# Patient Record
Sex: Female | Born: 1955 | Race: White | Smoking: Never smoker
Health system: Southern US, Community
[De-identification: ages and names within clinical notes are randomized; demographics above are authoritative.]

---

## 2014-01-23 DIAGNOSIS — H409 Unspecified glaucoma: Secondary | ICD-10-CM | POA: Insufficient documentation

## 2014-01-23 DIAGNOSIS — R928 Other abnormal and inconclusive findings on diagnostic imaging of breast: Secondary | ICD-10-CM | POA: Insufficient documentation

## 2014-01-23 DIAGNOSIS — N951 Menopausal and female climacteric states: Secondary | ICD-10-CM | POA: Insufficient documentation

## 2014-11-27 ENCOUNTER — Ambulatory Visit (INDEPENDENT_AMBULATORY_CARE_PROVIDER_SITE_OTHER): Payer: BLUE CROSS/BLUE SHIELD | Admitting: Family Medicine

## 2014-11-27 ENCOUNTER — Encounter: Payer: Self-pay | Admitting: Family Medicine

## 2014-11-27 ENCOUNTER — Encounter (INDEPENDENT_AMBULATORY_CARE_PROVIDER_SITE_OTHER): Payer: Self-pay

## 2014-11-27 VITALS — BP 141/85 | Ht 63.0 in | Wt 125.0 lb

## 2014-11-27 DIAGNOSIS — M25562 Pain in left knee: Secondary | ICD-10-CM

## 2014-11-27 MED ORDER — METHYLPREDNISOLONE ACETATE 40 MG/ML IJ SUSP
40.0000 mg | Freq: Once | INTRAMUSCULAR | Status: AC
  Administered 2014-11-27: 40 mg via INTRA_ARTICULAR

## 2014-11-27 NOTE — Patient Instructions (Signed)
Your knee pain is likely due to a flare of arthritis vs degenerative meniscus tear. Both are treated similarly. These are the medications you can take for this: Tylenol  1-2 tabs three times a day for pain. Aleve 1-2 tabs twice a day with food Glucosamine sulfate  twice a day is a supplement that may help. Capsaicin, aspercreme, or biofreeze topically up to four times a day may also help with pain. Cortisone injections are an option - you were given this today. If cortisone injections do not help, there are different types of shots that may help but they take longer to take effect. It's important that you continue to stay active. Straight leg raises, knee extensions 3 sets of 10 once a day (add ankle weight if these become too easy). Consider physical therapy to strengthen muscles around the joint that hurts to take pressure off of the joint itself. Fogel inserts with good arch support may be helpful. Heat or ice 15 minutes at a time 3-4 times a day as needed to help with pain. Water aerobics and cycling with low resistance are the best two types of exercise for arthritis. I would wait a week before returning to running. Follow up with me in 1 month or as needed.

## 2014-12-01 DIAGNOSIS — M25562 Pain in left knee: Secondary | ICD-10-CM | POA: Insufficient documentation

## 2014-12-01 NOTE — Assessment & Plan Note (Signed)
consistent with a flare of DJD vs degenerative meniscus tear.  Discussed options - went ahead with aspiration and injection - fluid consistent with either diagnosis above.  Discussed tylenol, nsaids, glucosamine, topical medications.  Home exercises as well, icing as needed.  F/u in 1 month or prn.  After informed written consent patient was lying supine on exam table.  Left knee was prepped with alcohol swab.  Utilizing superolateral approach, 3 mL of marcaine was used for local anesthesia.  Then using an 18g needle on 60cc syringe, 18 mL of clear straw-colored fluid was aspirated from left knee.  Knee was then injected with 3:1 marcaine:depomedrol.  Patient tolerated procedure well without immediate complications

## 2014-12-01 NOTE — Progress Notes (Signed)
PCP: No primary care provider on file.  Subjective:   HPI: Patient is a 59 y.o. female here for left knee pain.  Patient reports initially had problems with left knee about 1 1/2 years ago - running on [address],finished and left knee was sore. Improved after icing, taking ibuprofen. Then on Wednesday did same run about 3 miles and felt pain medial in left knee. Has been icing, taking aleve, elevating. Pain level still a 5/10. No catching, locking, giving out. Knee is swelling.  No past medical history on file.  No current outpatient prescriptions on file prior to visit.   No current facility-administered medications on file prior to visit.    No past surgical history on file.  Allergies  Allergen Reactions  . Penicillins   . Sulfa Antibiotics     Social History   Social History  . Marital Status: Divorced    Spouse Name: N/A  . Number of Children: N/A  . Years of Education: N/A   Occupational History  . Not on file.   Social History Main Topics  . Smoking status: Never Smoker   . Smokeless tobacco: Not on file  . Alcohol Use: Not on file  . Drug Use: Not on file  . Sexual Activity: Not on file   Other Topics Concern  . Not on file   Social History Narrative  . No narrative on file    No family history on file.  BP 141/85 mmHg  Ht  (1.6 m)  Wt 125 lb (56.7 kg)  BMI 22.15 kg/m2  Review of Systems: See HPI above.    Objective:  Physical Exam:  Gen: NAD  Left knee: Mild effusion.  No other gross deformity, ecchymoses. TTP medial joint line mildly.  No other tenderness. FROM. Negative ant/post drawers. Negative valgus/varus testing. Negative lachmanns. Negative mcmurrays, apleys, patellar apprehension. NV intact distally.    Assessment & Plan:  1. Left knee pain - consistent with a flare of DJD vs degenerative meniscus tear.  Discussed options - went ahead with aspiration and injection - fluid consistent with either diagnosis above.   Discussed tylenol, nsaids, glucosamine, topical medications.  Home exercises as well, icing as needed.  F/u in 1 month or prn.  After informed written consent patient was lying supine on exam table.  Left knee was prepped with alcohol swab.  Utilizing superolateral approach, 3 mL of marcaine was used for local anesthesia.  Then using an 18g needle on 60cc syringe, 18 mL of clear straw-colored fluid was aspirated from left knee.  Knee was then injected with 3:1 marcaine:depomedrol.  Patient tolerated procedure well without immediate complications

## 2015-04-07 ENCOUNTER — Encounter: Payer: Self-pay | Admitting: Family Medicine

## 2015-04-07 ENCOUNTER — Encounter (INDEPENDENT_AMBULATORY_CARE_PROVIDER_SITE_OTHER): Payer: Self-pay

## 2015-04-07 ENCOUNTER — Ambulatory Visit (HOSPITAL_BASED_OUTPATIENT_CLINIC_OR_DEPARTMENT_OTHER)
Admission: RE | Admit: 2015-04-07 | Discharge: 2015-04-07 | Disposition: A | Discharge status: Home / Self Care | Payer: BLUE CROSS/BLUE SHIELD | Source: Ambulatory Visit | Attending: Family Medicine | Admitting: Family Medicine

## 2015-04-07 ENCOUNTER — Ambulatory Visit (INDEPENDENT_AMBULATORY_CARE_PROVIDER_SITE_OTHER): Payer: BLUE CROSS/BLUE SHIELD | Admitting: Family Medicine

## 2015-04-07 VITALS — BP 132/84 | HR 73 | Ht 63.0 in | Wt 125.0 lb

## 2015-04-07 DIAGNOSIS — S99922A Unspecified injury of left foot, initial encounter: Secondary | ICD-10-CM | POA: Insufficient documentation

## 2015-04-07 DIAGNOSIS — M7989 Other specified soft tissue disorders: Secondary | ICD-10-CM | POA: Diagnosis not present

## 2015-04-07 DIAGNOSIS — X501XXA Overexertion from prolonged static or awkward postures, initial encounter: Secondary | ICD-10-CM | POA: Diagnosis not present

## 2015-04-07 NOTE — Patient Instructions (Signed)
You have a small avulsion fracture of your calcaneus (heel bone). Tylenol 500mg  1-2 tabs three times a day if needed for pain. Ibuprofen 600mg  three times a day if needed with food for pain and inflammation. Wear boot when up and walking around for next 4 weeks. Icing 15 minutes at a time 3-4 times a day. Elevate above your heart when possible. Follow up with me in 4 weeks for reevaluation.

## 2015-04-09 DIAGNOSIS — S99922A Unspecified injury of left foot, initial encounter: Secondary | ICD-10-CM | POA: Insufficient documentation

## 2015-04-09 NOTE — Progress Notes (Signed)
PCP: No primary care provider on file.  Subjective:   HPI: Patient is a 59 y.o. female here for left ankle pain.  Patient reports she was running down the steps yesterday on 12/27 when her foot slipped, she fell 2 steps and heard a snap lateral left foot/ankle. + swelling. Difficulty bearing weight initially but has improved. Pain mild throbbing, lateral at 5/10 level. Taking aleve. No prior injuries here. No skin changes, fever, other complaints.  No past medical history on file.  No current outpatient prescriptions on file prior to visit.   No current facility-administered medications on file prior to visit.    No past surgical history on file.  Allergies  Allergen Reactions  . Penicillins   . Sulfa Antibiotics     Social History   Social History  . Marital Status: Divorced    Spouse Name: N/A  . Number of Children: N/A  . Years of Education: N/A   Occupational History  . Not on file.   Social History Main Topics  . Smoking status: Never Smoker   . Smokeless tobacco: Not on file  . Alcohol Use: Not on file  . Drug Use: Not on file  . Sexual Activity: Not on file   Other Topics Concern  . Not on file   Social History Narrative    No family history on file.  BP 132/84 mmHg  Pulse 73  Ht 5\' 3"  (1.6 m)  Wt 125 lb (56.7 kg)  BMI 22.15 kg/m2  Review of Systems: See HPI above.    Objective:  Physical Exam:  Gen: NAD  Left ankle/foot: Mod swelling lateral dorsal foot. FROM TTP proximal dorsal lateral foot.  No malleolar, base 5th, navicular, other tenderness. Negative ant drawer and talar tilt.   Negative syndesmotic compression. Thompsons test negative. NV intact distally.  Right foot/ankle: FROM without pain.  Assessment & Plan:  1. Left foot injury - radiographs independently reviewed - small avulsion off calcaneus.  Should do well with conservative treatment.  Cam walker with weight bearing as tolerated.  Tylenol, nsaids as needed.   Icing, elevation.  F/u in 4 weeks for reevaluation.

## 2015-04-09 NOTE — Assessment & Plan Note (Signed)
radiographs independently reviewed - small avulsion off calcaneus.  Should do well with conservative treatment.  Cam walker with weight bearing as tolerated.  Tylenol, nsaids as needed.  Icing, elevation.  F/u in 4 weeks for reevaluation.

## 2015-05-05 ENCOUNTER — Ambulatory Visit (INDEPENDENT_AMBULATORY_CARE_PROVIDER_SITE_OTHER): Payer: BLUE CROSS/BLUE SHIELD | Admitting: Family Medicine

## 2015-05-05 ENCOUNTER — Encounter: Payer: Self-pay | Admitting: Family Medicine

## 2015-05-05 VITALS — BP 122/75 | HR 64 | Ht 63.0 in | Wt 125.0 lb

## 2015-05-05 DIAGNOSIS — S99922D Unspecified injury of left foot, subsequent encounter: Secondary | ICD-10-CM

## 2015-05-10 NOTE — Progress Notes (Signed)
PCP: No primary care provider on file.  Subjective:   HPI: Patient is a 60 y.o. female here for left ankle pain.  04/07/15: Patient reports she was running down the steps yesterday on 12/27 when her foot slipped, she fell 2 steps and heard a snap lateral left foot/ankle. + swelling. Difficulty bearing weight initially but has improved. Pain mild throbbing, lateral at 5/10 level. Taking aleve. No prior injuries here. No skin changes, fever, other complaints.  05/05/15: Patient reports she is much better. Pain level 0/10. Slight swelling as only complaint. No skin changes, fever.  No past medical history on file.  Current Outpatient Prescriptions on File Prior to Visit  Medication Sig Dispense Refill  . bimatoprost (LUMIGAN) 0.03 % ophthalmic solution 1 drop nightly.    Marland Kitchen estradiol (ESTRACE) 1 MG tablet Take 1 mg by mouth.    . progesterone (PROMETRIUM) 100 MG capsule Take 100 mg by mouth.     No current facility-administered medications on file prior to visit.    No past surgical history on file.  Allergies  Allergen Reactions  . Penicillins   . Sulfa Antibiotics     Social History   Social History  . Marital Status: Divorced    Spouse Name: N/A  . Number of Children: N/A  . Years of Education: N/A   Occupational History  . Not on file.   Social History Main Topics  . Smoking status: Never Smoker   . Smokeless tobacco: Not on file  . Alcohol Use: Not on file  . Drug Use: Not on file  . Sexual Activity: Not on file   Other Topics Concern  . Not on file   Social History Narrative    No family history on file.  BP 122/75 mmHg  Pulse 64  Ht  (1.6 m)  Wt 125 lb (56.7 kg)  BMI 22.15 kg/m2  Review of Systems: See HPI above.    Objective:  Physical Exam:  Gen: NAD  Left ankle/foot: Minimal swelling lateral dorsal foot. FROM No TTP proximal dorsal lateral foot.  No malleolar, base 5th, navicular, other tenderness. Negative ant drawer and  talar tilt.   Negative syndesmotic compression. Thompsons test negative. NV intact distally.  Right foot/ankle: FROM without pain.  Assessment & Plan:  1. Left foot injury - radiographs independently reviewed - small avulsion off calcaneus.  Clinically healed at this point.  Cleared for all activities without restrictions.  F/u prn.

## 2015-05-10 NOTE — Assessment & Plan Note (Signed)
radiographs independently reviewed - small avulsion off calcaneus.  Clinically healed at this point.  Cleared for all activities without restrictions.  F/u prn.

## 2017-07-08 IMAGING — DX DG FOOT COMPLETE 3+V*L*
3 series · 3 of 3 positions shown · non-contrast
Comparison: None.

CLINICAL DATA: Pain and swelling on the dorsum of the foot after
the patient sustained an inversion injury last night.

EXAM:
LEFT FOOT - COMPLETE 3+ VIEW

[foot ap]
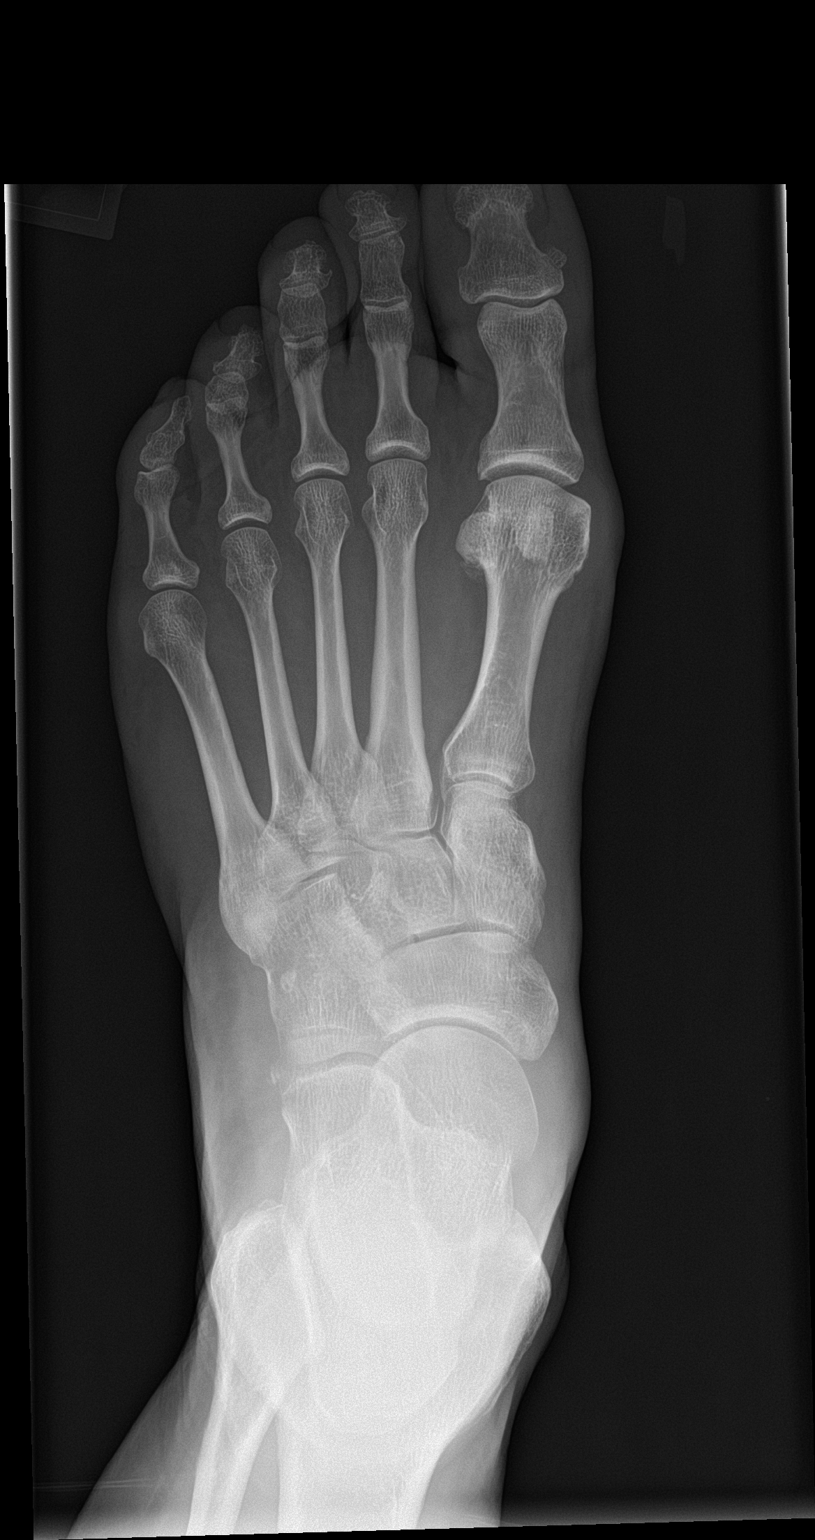

[foot obl]
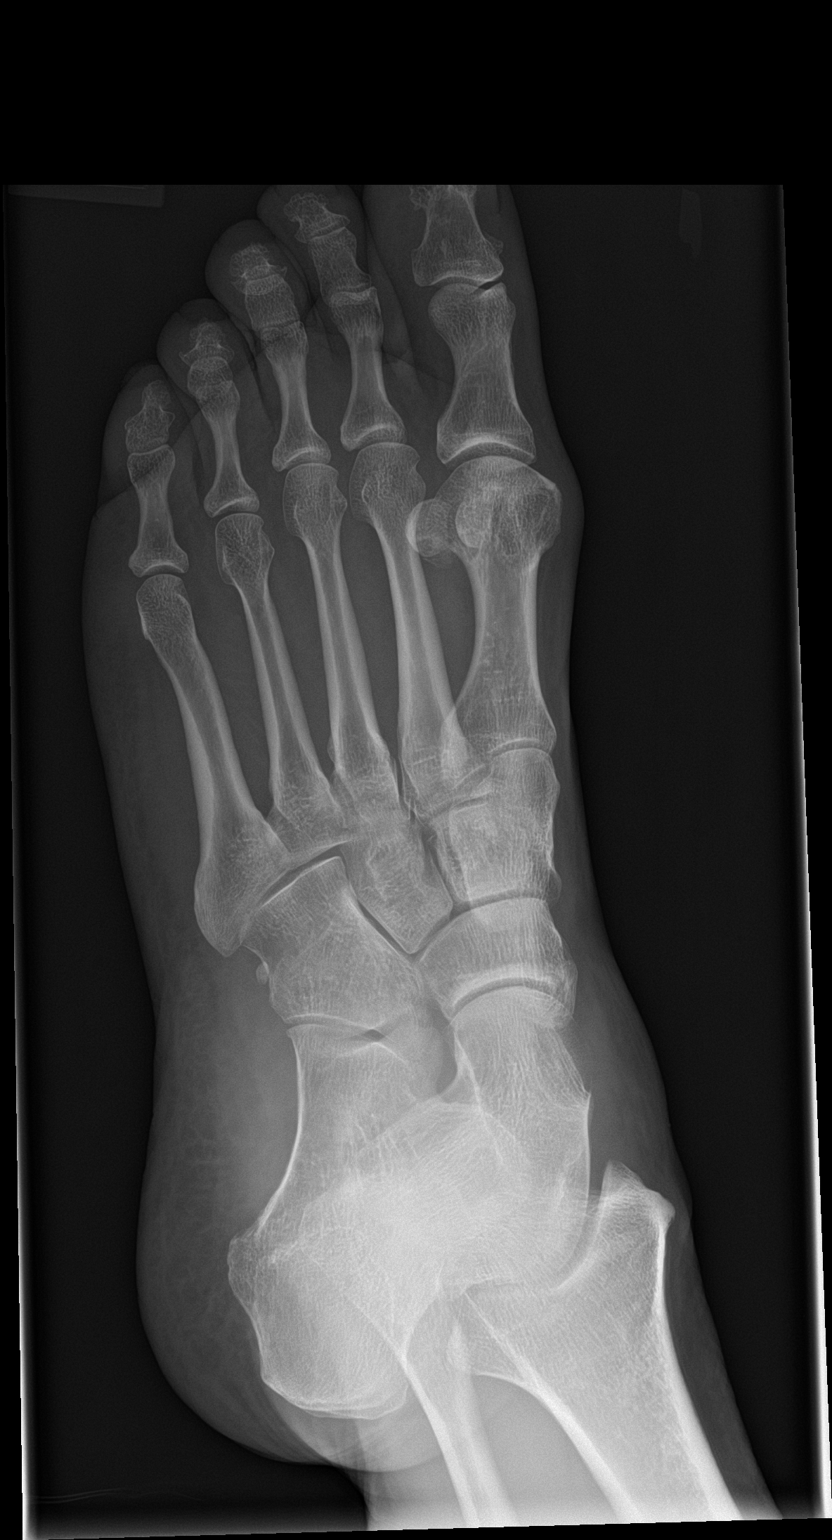

[foot lat]
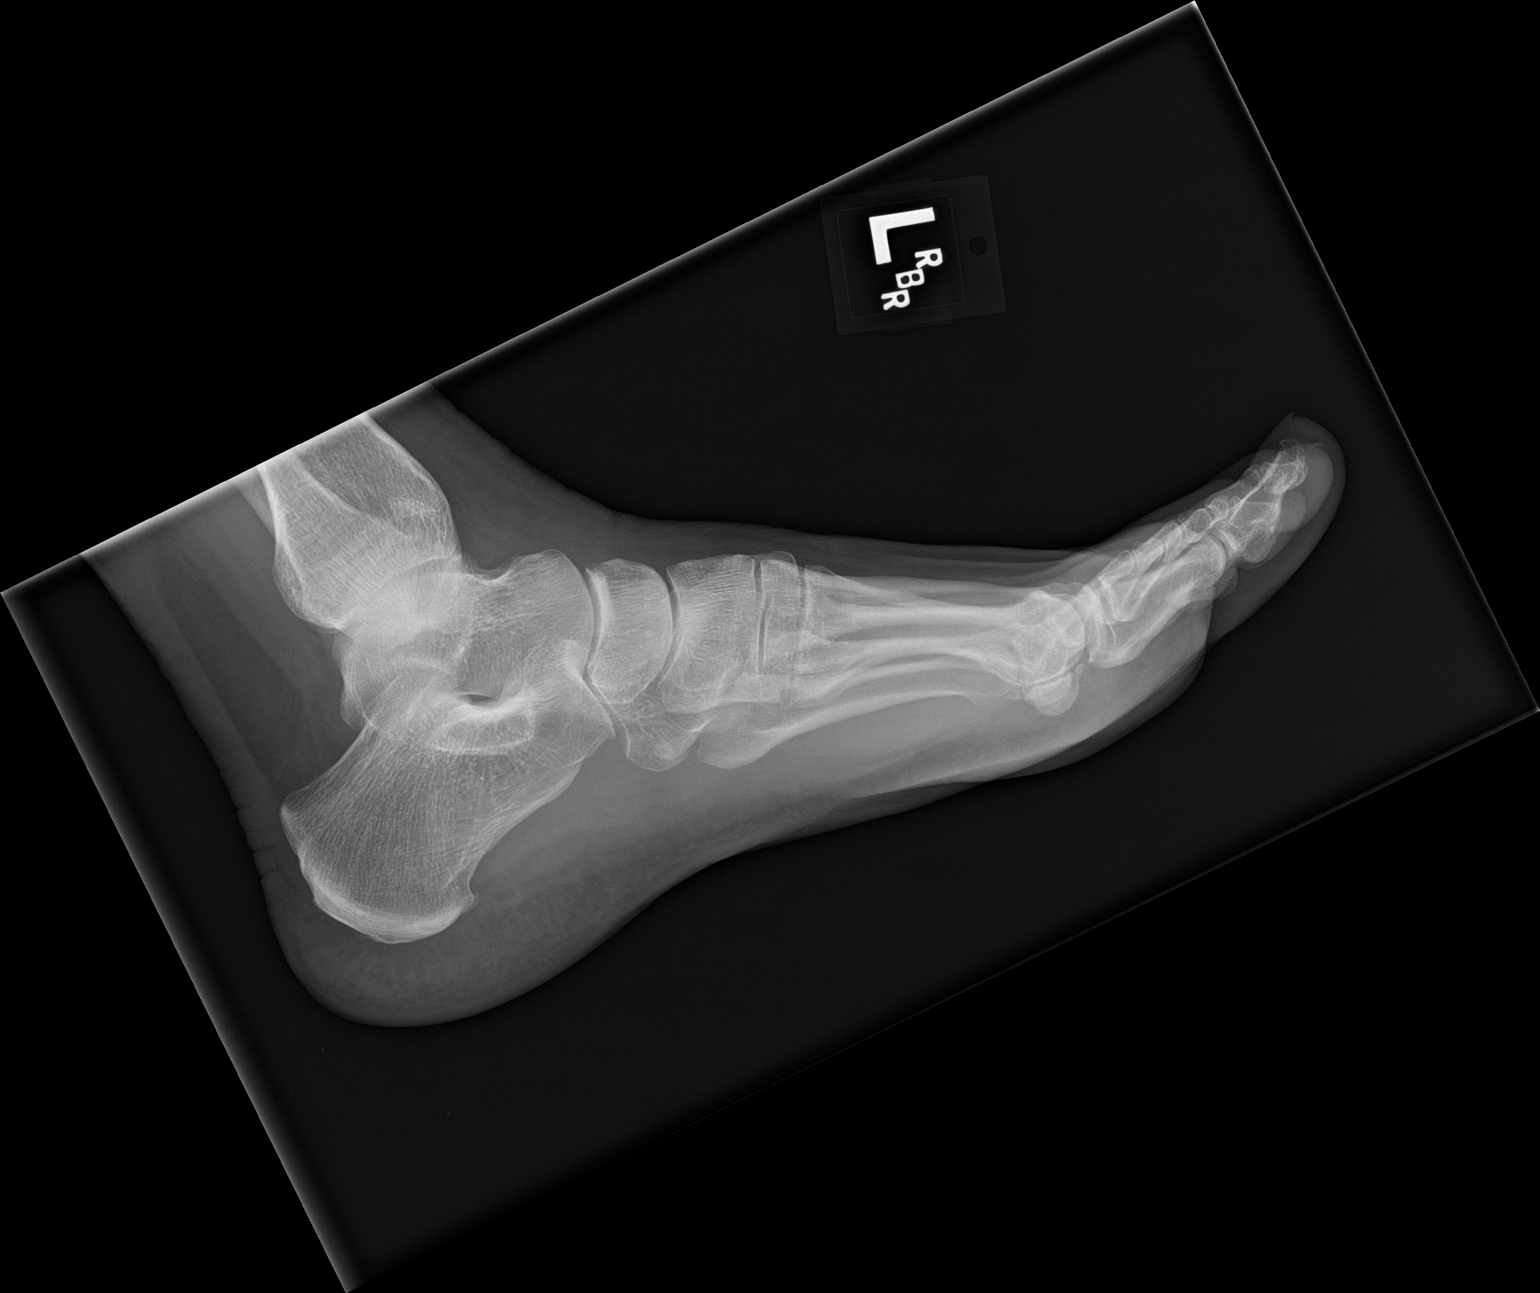

[3 of 3 positions shown; findings below may reference images not displayed]

FINDINGS: There is a tiny avulsion from the anterior process at the distal
lateral aspect of the calcaneus. There is soft tissue swelling
medially and laterally at the proximal foot with more prominent
laterally.

The remainder of the exam is normal.
IMPRESSION: Tiny avulsion from the anterior process of the distal calcaneus
laterally.

## 2023-06-12 DIAGNOSIS — Z1211 Encounter for screening for malignant neoplasm of colon: Secondary | ICD-10-CM | POA: Diagnosis not present

## 2023-06-19 DIAGNOSIS — S46001A Unspecified injury of muscle(s) and tendon(s) of the rotator cuff of right shoulder, initial encounter: Secondary | ICD-10-CM | POA: Diagnosis not present

## 2023-06-19 DIAGNOSIS — M6281 Muscle weakness (generalized): Secondary | ICD-10-CM | POA: Diagnosis not present

## 2023-06-19 DIAGNOSIS — M25511 Pain in right shoulder: Secondary | ICD-10-CM | POA: Diagnosis not present

## 2023-07-05 DIAGNOSIS — M25511 Pain in right shoulder: Secondary | ICD-10-CM | POA: Diagnosis not present

## 2023-07-05 DIAGNOSIS — S46001A Unspecified injury of muscle(s) and tendon(s) of the rotator cuff of right shoulder, initial encounter: Secondary | ICD-10-CM | POA: Diagnosis not present

## 2023-07-10 DIAGNOSIS — Z8619 Personal history of other infectious and parasitic diseases: Secondary | ICD-10-CM | POA: Diagnosis not present

## 2023-07-10 DIAGNOSIS — M8580 Other specified disorders of bone density and structure, unspecified site: Secondary | ICD-10-CM | POA: Diagnosis not present

## 2023-07-18 DIAGNOSIS — M25511 Pain in right shoulder: Secondary | ICD-10-CM | POA: Diagnosis not present

## 2023-08-01 DIAGNOSIS — M25511 Pain in right shoulder: Secondary | ICD-10-CM | POA: Diagnosis not present

## 2023-08-08 DIAGNOSIS — H5203 Hypermetropia, bilateral: Secondary | ICD-10-CM | POA: Diagnosis not present

## 2023-08-08 DIAGNOSIS — H269 Unspecified cataract: Secondary | ICD-10-CM | POA: Diagnosis not present

## 2023-08-08 DIAGNOSIS — H401132 Primary open-angle glaucoma, bilateral, moderate stage: Secondary | ICD-10-CM | POA: Diagnosis not present

## 2023-08-16 DIAGNOSIS — M25511 Pain in right shoulder: Secondary | ICD-10-CM | POA: Diagnosis not present

## 2023-08-30 DIAGNOSIS — H269 Unspecified cataract: Secondary | ICD-10-CM | POA: Diagnosis not present

## 2023-08-30 DIAGNOSIS — H401132 Primary open-angle glaucoma, bilateral, moderate stage: Secondary | ICD-10-CM | POA: Diagnosis not present

## 2023-09-11 DIAGNOSIS — Z5181 Encounter for therapeutic drug level monitoring: Secondary | ICD-10-CM | POA: Diagnosis not present

## 2023-09-11 DIAGNOSIS — R5383 Other fatigue: Secondary | ICD-10-CM | POA: Diagnosis not present

## 2023-09-11 DIAGNOSIS — S46001D Unspecified injury of muscle(s) and tendon(s) of the rotator cuff of right shoulder, subsequent encounter: Secondary | ICD-10-CM | POA: Diagnosis not present

## 2023-09-11 DIAGNOSIS — E559 Vitamin D deficiency, unspecified: Secondary | ICD-10-CM | POA: Diagnosis not present

## 2023-09-11 DIAGNOSIS — E038 Other specified hypothyroidism: Secondary | ICD-10-CM | POA: Diagnosis not present

## 2023-09-11 DIAGNOSIS — E782 Mixed hyperlipidemia: Secondary | ICD-10-CM | POA: Diagnosis not present

## 2023-09-16 DIAGNOSIS — S46011A Strain of muscle(s) and tendon(s) of the rotator cuff of right shoulder, initial encounter: Secondary | ICD-10-CM | POA: Diagnosis not present

## 2023-09-16 DIAGNOSIS — S46001D Unspecified injury of muscle(s) and tendon(s) of the rotator cuff of right shoulder, subsequent encounter: Secondary | ICD-10-CM | POA: Diagnosis not present

## 2023-09-19 DIAGNOSIS — M7501 Adhesive capsulitis of right shoulder: Secondary | ICD-10-CM | POA: Diagnosis not present

## 2023-09-19 DIAGNOSIS — M25511 Pain in right shoulder: Secondary | ICD-10-CM | POA: Diagnosis not present

## 2023-09-19 DIAGNOSIS — G8929 Other chronic pain: Secondary | ICD-10-CM | POA: Diagnosis not present

## 2023-09-26 DIAGNOSIS — H401132 Primary open-angle glaucoma, bilateral, moderate stage: Secondary | ICD-10-CM | POA: Diagnosis not present

## 2023-09-26 DIAGNOSIS — H2513 Age-related nuclear cataract, bilateral: Secondary | ICD-10-CM | POA: Diagnosis not present

## 2023-10-04 DIAGNOSIS — M25511 Pain in right shoulder: Secondary | ICD-10-CM | POA: Diagnosis not present

## 2023-10-18 DIAGNOSIS — M25511 Pain in right shoulder: Secondary | ICD-10-CM | POA: Diagnosis not present

## 2023-10-18 DIAGNOSIS — S46001A Unspecified injury of muscle(s) and tendon(s) of the rotator cuff of right shoulder, initial encounter: Secondary | ICD-10-CM | POA: Diagnosis not present

## 2023-11-01 DIAGNOSIS — S46001A Unspecified injury of muscle(s) and tendon(s) of the rotator cuff of right shoulder, initial encounter: Secondary | ICD-10-CM | POA: Diagnosis not present

## 2023-11-01 DIAGNOSIS — M25511 Pain in right shoulder: Secondary | ICD-10-CM | POA: Diagnosis not present

## 2023-11-16 DIAGNOSIS — R928 Other abnormal and inconclusive findings on diagnostic imaging of breast: Secondary | ICD-10-CM | POA: Diagnosis not present

## 2023-11-16 DIAGNOSIS — N6091 Unspecified benign mammary dysplasia of right breast: Secondary | ICD-10-CM | POA: Diagnosis not present

## 2023-11-16 DIAGNOSIS — R92333 Mammographic heterogeneous density, bilateral breasts: Secondary | ICD-10-CM | POA: Diagnosis not present

## 2023-11-19 DIAGNOSIS — S46001A Unspecified injury of muscle(s) and tendon(s) of the rotator cuff of right shoulder, initial encounter: Secondary | ICD-10-CM | POA: Diagnosis not present

## 2023-11-21 DIAGNOSIS — M7501 Adhesive capsulitis of right shoulder: Secondary | ICD-10-CM | POA: Diagnosis not present

## 2023-12-17 DIAGNOSIS — Z0000 Encounter for general adult medical examination without abnormal findings: Secondary | ICD-10-CM | POA: Diagnosis not present

## 2023-12-17 DIAGNOSIS — E785 Hyperlipidemia, unspecified: Secondary | ICD-10-CM | POA: Diagnosis not present

## 2023-12-19 DIAGNOSIS — S46001A Unspecified injury of muscle(s) and tendon(s) of the rotator cuff of right shoulder, initial encounter: Secondary | ICD-10-CM | POA: Diagnosis not present
